# Patient Record
Sex: Female | Born: 1935 | Race: White | Hispanic: No | Marital: Married | Smoking: Former smoker
Health system: Southern US, Community
[De-identification: ages and names within clinical notes are randomized; demographics above are authoritative.]

## PROBLEM LIST (undated history)

## (undated) DIAGNOSIS — M25462 Effusion, left knee: Secondary | ICD-10-CM

## (undated) DIAGNOSIS — C8330 Diffuse large B-cell lymphoma, unspecified site: Secondary | ICD-10-CM

## (undated) DIAGNOSIS — E119 Type 2 diabetes mellitus without complications: Secondary | ICD-10-CM

## (undated) DIAGNOSIS — I480 Paroxysmal atrial fibrillation: Secondary | ICD-10-CM

## (undated) DIAGNOSIS — Z7409 Other reduced mobility: Secondary | ICD-10-CM

## (undated) DIAGNOSIS — W19XXXA Unspecified fall, initial encounter: Secondary | ICD-10-CM

## (undated) DIAGNOSIS — S32001A Stable burst fracture of unspecified lumbar vertebra, initial encounter for closed fracture: Secondary | ICD-10-CM

## (undated) DIAGNOSIS — E039 Hypothyroidism, unspecified: Secondary | ICD-10-CM

## (undated) DIAGNOSIS — I10 Essential (primary) hypertension: Secondary | ICD-10-CM

## (undated) HISTORY — DX: Essential (primary) hypertension: I10

## (undated) HISTORY — DX: Stable burst fracture of unspecified lumbar vertebra, initial encounter for closed fracture: S32.001A

## (undated) HISTORY — DX: Diffuse large B-cell lymphoma, unspecified site: C83.30

## (undated) HISTORY — DX: Unspecified fall, initial encounter: W19.XXXA

## (undated) HISTORY — DX: Effusion, left knee: M25.462

## (undated) HISTORY — DX: Paroxysmal atrial fibrillation: I48.0

## (undated) HISTORY — DX: Type 2 diabetes mellitus without complications: E11.9

## (undated) HISTORY — DX: Other reduced mobility: Z74.09

## (undated) HISTORY — DX: Hypothyroidism, unspecified: E03.9

---

## 2011-10-05 ENCOUNTER — Other Ambulatory Visit (HOSPITAL_COMMUNITY): Payer: Self-pay | Admitting: Endocrinology

## 2011-10-05 DIAGNOSIS — E0590 Thyrotoxicosis, unspecified without thyrotoxic crisis or storm: Secondary | ICD-10-CM

## 2011-10-10 ENCOUNTER — Encounter (HOSPITAL_COMMUNITY)
Admission: RE | Admit: 2011-10-10 | Discharge: 2011-10-10 | Disposition: A | Discharge status: Home / Self Care | Payer: Medicare PPO | Source: Ambulatory Visit | Attending: Endocrinology | Admitting: Endocrinology

## 2011-10-10 DIAGNOSIS — E0590 Thyrotoxicosis, unspecified without thyrotoxic crisis or storm: Secondary | ICD-10-CM

## 2011-10-11 ENCOUNTER — Encounter (HOSPITAL_COMMUNITY)
Admission: RE | Admit: 2011-10-11 | Discharge: 2011-10-11 | Disposition: A | Discharge status: Home / Self Care | Payer: Medicare PPO | Source: Ambulatory Visit | Attending: Endocrinology | Admitting: Endocrinology

## 2011-10-11 MED ORDER — SODIUM PERTECHNETATE TC 99M INJECTION
9.8000 | Freq: Once | INTRAVENOUS | Status: AC | PRN
  Administered 2011-10-11: 9.8 via INTRAVENOUS

## 2011-10-11 MED ORDER — SODIUM IODIDE I 131 CAPSULE
8.3000 | Freq: Once | INTRAVENOUS | Status: AC | PRN
  Administered 2011-10-10: 8.3 via ORAL

## 2011-11-06 ENCOUNTER — Ambulatory Visit (HOSPITAL_COMMUNITY): Payer: Self-pay

## 2011-11-07 ENCOUNTER — Other Ambulatory Visit (HOSPITAL_COMMUNITY): Payer: Self-pay

## 2012-07-05 ENCOUNTER — Other Ambulatory Visit (HOSPITAL_COMMUNITY): Payer: Self-pay | Admitting: Endocrinology

## 2012-07-05 DIAGNOSIS — E039 Hypothyroidism, unspecified: Secondary | ICD-10-CM

## 2012-07-05 DIAGNOSIS — E0590 Thyrotoxicosis, unspecified without thyrotoxic crisis or storm: Secondary | ICD-10-CM

## 2012-08-19 ENCOUNTER — Encounter (HOSPITAL_COMMUNITY)
Admission: RE | Admit: 2012-08-19 | Discharge: 2012-08-19 | Disposition: A | Discharge status: Home / Self Care | Payer: Medicare PPO | Source: Ambulatory Visit | Attending: Endocrinology | Admitting: Endocrinology

## 2012-08-19 DIAGNOSIS — E039 Hypothyroidism, unspecified: Secondary | ICD-10-CM

## 2012-08-19 DIAGNOSIS — E0590 Thyrotoxicosis, unspecified without thyrotoxic crisis or storm: Secondary | ICD-10-CM | POA: Insufficient documentation

## 2012-08-20 ENCOUNTER — Encounter (HOSPITAL_COMMUNITY)
Admission: RE | Admit: 2012-08-20 | Discharge: 2012-08-20 | Disposition: A | Discharge status: Home / Self Care | Payer: Medicare PPO | Source: Ambulatory Visit | Attending: Endocrinology | Admitting: Endocrinology

## 2012-08-20 MED ORDER — SODIUM IODIDE I 131 CAPSULE: 10.0000 | Freq: Once | INTRAVENOUS | Status: AC | PRN

## 2012-08-23 ENCOUNTER — Encounter (HOSPITAL_COMMUNITY)
Admission: RE | Admit: 2012-08-23 | Discharge: 2012-08-23 | Disposition: A | Discharge status: Home / Self Care | Payer: Medicare PPO | Source: Ambulatory Visit | Attending: Endocrinology | Admitting: Endocrinology

## 2012-08-23 DIAGNOSIS — E0590 Thyrotoxicosis, unspecified without thyrotoxic crisis or storm: Secondary | ICD-10-CM

## 2012-08-23 MED ORDER — SODIUM IODIDE I 131 CAPSULE
31.3000 | Freq: Once | INTRAVENOUS | Status: AC | PRN
  Administered 2012-08-23: 31.3 via ORAL

## 2015-11-07 DIAGNOSIS — C8330 Diffuse large B-cell lymphoma, unspecified site: Secondary | ICD-10-CM | POA: Insufficient documentation

## 2015-11-07 HISTORY — DX: Diffuse large B-cell lymphoma, unspecified site: C83.30

## 2016-01-10 DIAGNOSIS — I480 Paroxysmal atrial fibrillation: Secondary | ICD-10-CM

## 2016-01-10 HISTORY — DX: Paroxysmal atrial fibrillation: I48.0

## 2016-04-18 DIAGNOSIS — M25462 Effusion, left knee: Secondary | ICD-10-CM

## 2016-04-18 HISTORY — DX: Effusion, left knee: M25.462

## 2016-12-23 DIAGNOSIS — S32001A Stable burst fracture of unspecified lumbar vertebra, initial encounter for closed fracture: Secondary | ICD-10-CM

## 2016-12-23 DIAGNOSIS — W19XXXA Unspecified fall, initial encounter: Secondary | ICD-10-CM

## 2016-12-23 HISTORY — DX: Stable burst fracture of unspecified lumbar vertebra, initial encounter for closed fracture: S32.001A

## 2016-12-23 HISTORY — PX: PUNCH BIOPSY OF SKIN: SHX6390

## 2016-12-23 HISTORY — DX: Unspecified fall, initial encounter: W19.XXXA

## 2016-12-24 LAB — BASIC METABOLIC PANEL
BUN: 19 (ref 4–21)
CREATININE: 1.4 — AB (ref 0.5–1.1)
POTASSIUM: 3.6 (ref 3.4–5.3)
Sodium: 131 — AB (ref 137–147)

## 2016-12-24 LAB — CBC AND DIFFERENTIAL
HCT: 41 (ref 36–46)
Hemoglobin: 14 (ref 12.0–16.0)
PLATELETS: 205 (ref 150–399)
WBC: 11.5

## 2016-12-24 LAB — POCT INR: INR: 1 (ref 0.9–1.1)

## 2016-12-27 HISTORY — PX: POSTERIOR LAMINECTOMY THORACIC AND LUMBAR SPINE: SHX2251

## 2017-01-04 ENCOUNTER — Encounter: Payer: Self-pay | Admitting: Internal Medicine

## 2017-01-04 ENCOUNTER — Non-Acute Institutional Stay (SKILLED_NURSING_FACILITY): Payer: Medicare PPO | Admitting: Internal Medicine

## 2017-01-04 DIAGNOSIS — I10 Essential (primary) hypertension: Secondary | ICD-10-CM

## 2017-01-04 DIAGNOSIS — E119 Type 2 diabetes mellitus without complications: Secondary | ICD-10-CM | POA: Diagnosis not present

## 2017-01-04 DIAGNOSIS — C8599 Non-Hodgkin lymphoma, unspecified, extranodal and solid organ sites: Secondary | ICD-10-CM

## 2017-01-04 DIAGNOSIS — I480 Paroxysmal atrial fibrillation: Secondary | ICD-10-CM

## 2017-01-04 DIAGNOSIS — E034 Atrophy of thyroid (acquired): Secondary | ICD-10-CM | POA: Diagnosis not present

## 2017-01-04 DIAGNOSIS — C8330 Diffuse large B-cell lymphoma, unspecified site: Secondary | ICD-10-CM | POA: Diagnosis not present

## 2017-01-04 DIAGNOSIS — S32001D Stable burst fracture of unspecified lumbar vertebra, subsequent encounter for fracture with routine healing: Secondary | ICD-10-CM

## 2017-01-04 NOTE — Progress Notes (Signed)
: Provider:  Noah Delaine. Suzanne Coil, MD Location:  [place] Room Number: 111 Place of Service:  SNF (31)  PCP: Jacelyn Pi, MD Patient Care Team: Jacelyn Pi, MD as PCP - General (Endocrinology)  Extended Emergency Contact Information Primary Emergency Contact: Maryclare Bean [place] Phone: [phone] Relation: Spouse     Allergies: Penicillins  Chief Complaint  Patient presents with  . New Admit To SNF    following hospitalization 12/23/16 to 01/03/17 [facility], L2 fracture after a fall    HPI: Patient is 83 y.o. female with diffuse large B-cell lymphoma, status post chemotherapy/radiation, atrial fib on Cirella toe, DM 2, hypertension, hypothyroidism who presented to an outside hospital with generalized lower extremity weakness. Patient had a history of recent L2 compression fracture in April 2018 managed conservatively by her PCP. She had some right radicular symptoms which had been intermittent since that fall. She reports some lower extremity weakness starting several days prior to presentation. She developed worsening lower extremity weakness yesterday and fell down and was unable to ambulate afterwards she was taken by EMS to outside hospital then brought to [place] [place] in ED showed a chronic L2 burst fracture with retropulsion. She denies any back pain currently or radicular symptoms. She denied any paresthesias or bowel bowel/bladder incontinence. She took her last xarelto yesterday morning. Patient was admitted to [facility] from 7/28-8/8 where she underwent back surgery to stabilize her  L2 burst fracture. Hospital course was complicated by the findings of a scalp lesion for which dermatology was consult 8 and a punch biopsy was taken which showed lymphoma cutis. Postsurgically patient did well and is ready for admission to skilled nursing for rehabilitation she was started on  her home dose of Eliquis on 12/30/16. Patient is admitted to skilled nursing facility for OT/PT. While at skilled nursing facility patient will be followed for atrial fibrillation treated with amiodarone and Eliquis and metoprolol, hypertension treated with clonidine patch Lasix hydralazine and metoprolol and diabetes mellitus treated with Amaryl, Januvia, pt on ARB and statin.  Past Medical History:  Diagnosis Date  . Closed burst fracture of lumbar vertebra ([place]) 12/23/2016  . DLBCL (diffuse large B cell lymphoma) ([place]) 11/07/2015  . DM (diabetes mellitus) ([place])   . Effusion, left knee 04/18/2016  . Fall 12/23/2016  . Hypertension   . Hypothyroidism   . Mobility impaired   . Paroxysmal atrial fibrillation ([place]) 01/10/2016    Past Surgical History:  Procedure Laterality Date  . POSTERIOR LAMINECTOMY THORACIC AND LUMBAR SPINE  12/27/2016   at L2. Dr. Royetta Asal  . PUNCH BIOPSY OF SKIN  12/23/2016   scalp, lymphoma cutis    Allergies as of 01/04/2017      Reactions   Penicillins       Medication List       Accurate as of 01/04/17 12:26 PM. Always use your most recent med list.          acetaminophen 325 MG tablet Commonly known as:  TYLENOL Take 650 mg by mouth. Take 2 tablets every 8 hours as needed for mild pain or fever   allopurinol 100 MG tablet Commonly known as:  ZYLOPRIM Take 100 mg by mouth. Take 1/2 tablet (50 mg) nightly   amiodarone 200 MG tablet Commonly known as:  PACERONE Take 200 mg by mouth. Take one tablet daily   Ammonium Lactate 10 % Crea Apply topically. Apply to  hip area as needed   cloNIDine 0.3 mg/24hr patch Commonly known as:  CATAPRES - Dosed in mg/24 hr Place 0.3 mg onto the skin once a week.   cyclobenzaprine 7.5 MG tablet Commonly known as:  FEXMID Take 7.5 mg by mouth. Take one tablet twice daily as needed for up to 10 days for muscle spasms   diphenhydramine-acetaminophen 25-500 MG Tabs tablet Commonly known as:  TYLENOL  PM Take by mouth. Take 2 tablets nightly as needed for sleep   ELIQUIS 2.5 MG Tabs tablet Generic drug:  apixaban Take 2.5 mg by mouth. Take one tablet twice a day   furosemide 40 MG tablet Commonly known as:  LASIX Take 40 mg by mouth. Take one tablet twice daily   glimepiride 4 MG tablet Commonly known as:  AMARYL Take 4 mg by mouth. Take one tablet daily   hydrALAZINE 50 MG tablet Commonly known as:  APRESOLINE Take 50 mg by mouth. Take one tablet twice daily   levothyroxine 25 MCG tablet Commonly known as:  SYNTHROID, LEVOTHROID Take 25 mcg by mouth daily before breakfast.   losartan 25 MG tablet Commonly known as:  COZAAR Take 25 mg by mouth. Take one tablet daily   lovastatin 20 MG tablet Commonly known as:  MEVACOR Take 20 mg by mouth. Take one tablet at bedtime   metoprolol succinate 100 MG 24 hr tablet Commonly known as:  TOPROL-XL Take 100 mg by mouth. Take one tablet daily   mupirocin ointment 2 % Commonly known as:  BACTROBAN Place 1 application into the nose. Apply once daily to drain site (and apply after showering)   oxycodone 5 MG capsule Commonly known as:  OXY-IR Take 5 mg by mouth. Take one - two tablet every 6 hours as needed for pain   polyethylene glycol packet Commonly known as:  MIRALAX / GLYCOLAX Take 17 g by mouth. Take 17 gram daily as needed for constipation   potassium chloride SA 20 MEQ tablet Commonly known as:  K-DUR,KLOR-CON Take 20 mEq by mouth. Take one tablet daily   saccharomyces boulardii 250 MG capsule Commonly known as:  FLORASTOR Take 250 mg by mouth. Take one tablet twice daily for probiotic   senna-docusate 8.6-50 MG tablet Commonly known as:  Senokot-S Take 1 tablet by mouth. Take 2 tablets nightly   sitaGLIPtin 50 MG tablet Commonly known as:  JANUVIA Take by mouth. Take one tablet daily   verapamil 120 MG tablet Commonly known as:  CALAN Take 120 mg by mouth. Take one tablet every 12 hours       Meds  ordered this encounter  Medications  . acetaminophen (TYLENOL) 325 MG tablet    Sig: Take 650 mg by mouth. Take 2 tablets every 8 hours as needed for mild pain or fever  . cyclobenzaprine (FEXMID) 7.5 MG tablet    Sig: Take 7.5 mg by mouth. Take one tablet twice daily as needed for up to 10 days for muscle spasms  . oxycodone (OXY-IR) 5 MG capsule    Sig: Take 5 mg by mouth. Take one - two tablet every 6 hours as needed for pain  . mupirocin ointment (BACTROBAN) 2 %    Sig: Place 1 application into the nose. Apply once daily to drain site (and apply after showering)  . senna-docusate (SENOKOT-S) 8.6-50 MG tablet    Sig: Take 1 tablet by mouth. Take 2 tablets nightly  . verapamil (CALAN) 120 MG tablet    Sig: Take 120  mg by mouth. Take one tablet every 12 hours  . allopurinol (ZYLOPRIM) 100 MG tablet    Sig: Take 100 mg by mouth. Take 1/2 tablet (50 mg) nightly  . amiodarone (PACERONE) 200 MG tablet    Sig: Take 200 mg by mouth. Take one tablet daily  . Ammonium Lactate 10 % CREA    Sig: Apply topically. Apply to hip area as needed  . cloNIDine (CATAPRES - DOSED IN MG/24 HR) 0.3 mg/24hr patch    Sig: Place 0.3 mg onto the skin once a week.  . diphenhydramine-acetaminophen (TYLENOL PM) 25-500 MG TABS tablet    Sig: Take by mouth. Take 2 tablets nightly as needed for sleep  . apixaban (ELIQUIS) 2.5 MG TABS tablet    Sig: Take 2.5 mg by mouth. Take one tablet twice a day  . furosemide (LASIX) 40 MG tablet    Sig: Take 40 mg by mouth. Take one tablet twice daily  . glimepiride (AMARYL) 4 MG tablet    Sig: Take 4 mg by mouth. Take one tablet daily  . hydrALAZINE (APRESOLINE) 50 MG tablet    Sig: Take 50 mg by mouth. Take one tablet twice daily  . sitaGLIPtin (JANUVIA) 50 MG tablet    Sig: Take by mouth. Take one tablet daily  . levothyroxine (SYNTHROID, LEVOTHROID) 25 MCG tablet    Sig: Take 25 mcg by mouth daily before breakfast.  . losartan (COZAAR) 25 MG tablet    Sig: Take 25 mg by  mouth. Take one tablet daily  . lovastatin (MEVACOR) 20 MG tablet    Sig: Take 20 mg by mouth. Take one tablet at bedtime  . metoprolol succinate (TOPROL-XL) 100 MG 24 hr tablet    Sig: Take 100 mg by mouth. Take one tablet daily  . polyethylene glycol (MIRALAX / GLYCOLAX) packet    Sig: Take 17 g by mouth. Take 17 gram daily as needed for constipation  . potassium chloride SA (K-DUR,KLOR-CON) 20 MEQ tablet    Sig: Take 20 mEq by mouth. Take one tablet daily  . saccharomyces boulardii (FLORASTOR) 250 MG capsule    Sig: Take 250 mg by mouth. Take one tablet twice daily for probiotic    Immunization History  Administered Date(s) Administered  . Influenza-Unspecified 02/27/2016    Social History  Substance Use Topics  . Smoking status: Former Smoker    Types: Cigarettes  . Smokeless tobacco: Never Used  . Alcohol use No    Family history is   Family History  Problem Relation Age of Onset  . Heart disease Mother   . Hypertension Mother   . Heart disease Father   . Stroke Father   . Heart disease Brother   . Diabetes Paternal Grandfather       Review of Systems  DATA OBTAINED: from patient, nurse GENERAL:  no fevers, fatigue, appetite changes SKIN: No itching, or rash EYES: No eye pain, redness, discharge EARS: No earache, tinnitus, change in hearing NOSE: No congestion, drainage or bleeding  MOUTH/THROAT: No mouth or tooth pain, No sore throat RESPIRATORY: No cough, wheezing, SOB CARDIAC: No chest pain, palpitations, lower extremity edema  GI: No abdominal pain, No N/V/D or constipation, No heartburn or reflux  GU: No dysuria, frequency or urgency, or incontinence  MUSCULOSKELETAL: No unrelieved bone/joint pain NEUROLOGIC: No headache, dizziness or focal weakness PSYCHIATRIC: No c/o anxiety or sadness   Vitals:   01/04/17 1129  BP: 114/78  Pulse: 80  Resp: 18  Temp: 98  F (36.7 C)  SpO2: 96%    SpO2 Readings from Last 1 Encounters:  01/04/17 96%   Body  mass index is 31.31 kg/m.     Physical Exam  GENERAL APPEARANCE: Alert, conversant,  No acute distress.  SKIN: No diaphoresis rash HEAD: Normocephalic, atraumatic  EYES: Conjunctiva/lids clear. Pupils round, reactive. EOMs intact.  EARS: External exam WNL, canals clear. Hearing grossly normal.  NOSE: No deformity or discharge.  MOUTH/THROAT: Lips w/o lesions  RESPIRATORY: Breathing is even, unlabored. Lung sounds are clear   CARDIOVASCULAR: Heart RRR no murmurs, rubs or gallops. No peripheral edema.   GASTROINTESTINAL: Abdomen is soft, non-tender, not distended w/ normal bowel sounds. GENITOURINARY: Bladder non tender, not distended  MUSCULOSKELETAL: Wearing back brace NEUROLOGIC:  Cranial nerves 2-12 grossly intact. Moves all extremities  PSYCHIATRIC: Mood and affect appropriate to situation, no behavioral issues  There are no active problems to display for this patient.     Labs reviewed: Basic Metabolic Panel:    Component Value Date/Time   NA 131 (A) 12/24/2016   K 3.6 12/24/2016   BUN 19 12/24/2016   CREATININE 1.4 (A) 12/24/2016     Recent Labs  12/24/16  NA 131*  K 3.6  BUN 19  CREATININE 1.4*   Liver Function Tests: No results for input(s): AST, ALT, ALKPHOS, BILITOT, PROT, ALBUMIN in the last 8760 hours. No results for input(s): LIPASE, AMYLASE in the last 8760 hours. No results for input(s): AMMONIA in the last 8760 hours. CBC:  Recent Labs  12/24/16  WBC 11.5  HGB 14.0  HCT 41  PLT 205   Lipid No results for input(s): CHOL, HDL, LDLCALC, TRIG in the last 8760 hours.  Cardiac Enzymes: No results for input(s): CKTOTAL, CKMB, CKMBINDEX, TROPONINI in the last 8760 hours. BNP: No results for input(s): BNP in the last 8760 hours. No results found for: MICROALBUR No results found for: HGBA1C No results found for: TSH No results found for: VITAMINB12 No results found for: FOLATE No results found for: IRON, TIBC, FERRITIN  Imaging and  Procedures obtained prior to SNF admission: Nm Rai Therapy For Hyperthyroidism  Result Date: 08/23/2012 *RADIOLOGY REPORT* Clinical Data: Hyperthyroidism NUCLEAR MEDICINE RADIOACTIVE IODINE THERAPY FOR HYPERTHYROIDISM Technique:  The risks and benefits of radioactive iodine therapy were discussed with the patient in detail. Alternative therapies were also mentioned. Radiation safety was discussed with the patient, including how to protect the general public from exposure. There were no barriers to communication.  Written consent was obtained.  The patient then received a capsule containing the radiopharmaceutical.  The patient will follow-up with the referring physician. Radiopharmaceutical: 31.3MILLI CURIE I-131 SODIUM IODIDE I 131 CAPSULE Comparison: Thyroid uptake 08/20/12 Findings: The patient has a laboratory values and symptomology consistent with hyperthyroidism . IMPRESSION:  I 131 therapy for hyperthyroidism (31.3 mCi) Original Report Authenticated By: Suzy Bouchard, M.D.     Not all labs, radiology exams or other studies done during hospitalization come through on my EPIC note; however they are reviewed by me.    Assessment and Plan  L2 BURST FRACTURE WITH BONY RETROPULSION-patient was taken to the OR and stabilized there were no complications with the surgery; SNF -patient is admitted to skilled nursing for OT PT; patient's pain medicine was changed to oxycodone as she was being admitted with multiple doses of Tylenol) 3 different medicines that would far exceed the daily 1 minute for a person of her age; Flexeril was scheduled  DLBCL (diffuse large B-cell lymphoma)/LYMPHOMA CUTIS-the last  visit new diagnosis based on a finding of a pink lesion on the scalp with resultant biopsy SNF-patient is to follow-up with her oncology as an outpatient; she is quite anxious about this  PAROXYSMAL ATRIAL FIB SNF -controlled; continue amiodarone 200 mg by mouth daily, Toprol-XL 100 mg by mouth  daily, verapamil 120 mg SR 1 by mouth every 12 hours and Eliquis 2.5 mg twice a day as prophylaxis  HYPERTENSION SNF -controlled plan to continue clonidine patch 0.3/24, 1 patch weekly, Lasix 40 mg twice a day, hydralazine 50 mg twice a day, Cozaar 25 mg daily, verapamil 120 mg SR 1 by mouth every 12 hours and metoprolol XL 100 mg by mouth daily; we will monitor every shift  DM 2 SNF -not stated as uncontrolled no labs are available; plan to continue Amaryl 4 mg daily and Januvia 50 mg daily; patient is on and a ARB and a statin  HYPERLIPIDEMIA ASSOCIATED WITH DIABETES mellitus type 2 SNF -plan to continue Mevacor 20 mg by mouth daily  HYPOTHYROIDISM SNF -not stated as uncontrolled; plan to continue Synthroid 25 g by mouth daily   Time spent greater than 45 minutes;> 50% of time with patient was spent reviewing records, labs, tests and studies, counseling and developing plan of care  Webb Silversmith D. Suzanne Coil, MD

## 2017-01-06 ENCOUNTER — Encounter: Payer: Self-pay | Admitting: Internal Medicine

## 2017-01-06 DIAGNOSIS — I10 Essential (primary) hypertension: Secondary | ICD-10-CM | POA: Insufficient documentation

## 2017-01-06 DIAGNOSIS — C8599 Non-Hodgkin lymphoma, unspecified, extranodal and solid organ sites: Secondary | ICD-10-CM | POA: Insufficient documentation

## 2017-01-06 DIAGNOSIS — E119 Type 2 diabetes mellitus without complications: Secondary | ICD-10-CM | POA: Insufficient documentation

## 2017-01-06 DIAGNOSIS — E039 Hypothyroidism, unspecified: Secondary | ICD-10-CM | POA: Insufficient documentation

## 2017-01-08 LAB — BASIC METABOLIC PANEL
BUN: 13 (ref 4–21)
Creatinine: 1.3 — AB (ref 0.5–1.1)
Glucose: 94
Potassium: 4.3 (ref 3.4–5.3)
Sodium: 140 (ref 137–147)

## 2017-01-08 LAB — CBC AND DIFFERENTIAL
HEMATOCRIT: 32 — AB (ref 36–46)
HEMOGLOBIN: 10.9 — AB (ref 12.0–16.0)
Platelets: 279 (ref 150–399)
WBC: 8.7

## 2017-01-10 ENCOUNTER — Other Ambulatory Visit: Payer: Self-pay

## 2017-01-22 ENCOUNTER — Non-Acute Institutional Stay (SKILLED_NURSING_FACILITY): Payer: Medicare PPO | Admitting: Internal Medicine

## 2017-01-22 ENCOUNTER — Encounter: Payer: Self-pay | Admitting: Internal Medicine

## 2017-01-22 DIAGNOSIS — C8599 Non-Hodgkin lymphoma, unspecified, extranodal and solid organ sites: Secondary | ICD-10-CM

## 2017-01-22 DIAGNOSIS — C8330 Diffuse large B-cell lymphoma, unspecified site: Secondary | ICD-10-CM | POA: Diagnosis not present

## 2017-01-22 DIAGNOSIS — I10 Essential (primary) hypertension: Secondary | ICD-10-CM | POA: Diagnosis not present

## 2017-01-22 DIAGNOSIS — S32001D Stable burst fracture of unspecified lumbar vertebra, subsequent encounter for fracture with routine healing: Secondary | ICD-10-CM

## 2017-01-22 DIAGNOSIS — I480 Paroxysmal atrial fibrillation: Secondary | ICD-10-CM | POA: Diagnosis not present

## 2017-01-22 DIAGNOSIS — E034 Atrophy of thyroid (acquired): Secondary | ICD-10-CM

## 2017-01-22 DIAGNOSIS — E119 Type 2 diabetes mellitus without complications: Secondary | ICD-10-CM

## 2017-01-22 NOTE — Progress Notes (Signed)
Location:  [place] Room Number: 111 Place of Service:  SNF (31)  Suzanne Alvarado. Suzanne Coil, MD  PCP: Jacelyn Pi, MD Patient Care Team: Jacelyn Pi, MD as PCP - General (Endocrinology)  Extended Emergency Contact Information Primary Emergency Contact: [address] [place], [place] of [place] Phone: [phone] Relation: Spouse Secondary Emergency Contact: [place] of [place] Mobile Phone: [phone] Relation: Daughter  Allergies  Allergen Reactions  . Penicillins     Chief Complaint  Patient presents with  . Discharge Note    discharge from SNF to home    HPI:  83 y.o. female  with diffuse large B-cell lymphoma, status post chemotherapy/radiation, atrial fib on xarelto, diabetes mellitus 2, hypertension, hypothyroidism who was admitted to [facility] from 7/28-8/8 where she was found to have an L2 burst fracture which was stage STABILIZED surgically. During that time she also had a punch biopsy done of a lesion on her scalp which showed lymphoma cutis. Patient is a admitted to skilled nursing facility with generalized weakness for OT PT and is now ready to be DC to home.    Past Medical History:  Diagnosis Date  . Closed burst fracture of lumbar vertebra ([place]) 12/23/2016  . DLBCL (diffuse large B cell lymphoma) ([place]) 11/07/2015  . DM (diabetes mellitus) ([place])   . Effusion, left knee 04/18/2016  . Fall 12/23/2016  . Hypertension   . Hypothyroidism   . Mobility impaired   . Paroxysmal atrial fibrillation ([place]) 01/10/2016    Past Surgical History:  Procedure Laterality Date  . POSTERIOR LAMINECTOMY THORACIC AND LUMBAR SPINE  12/27/2016   at L2. Dr. Royetta Asal  . PUNCH BIOPSY OF SKIN  12/23/2016   scalp, lymphoma cutis     reports that she has quit smoking. Her smoking use included Cigarettes. She has never used smokeless tobacco. She reports that she does not drink  alcohol or use drugs. Social History   Social History  . Marital status: Married    Spouse name: N/A  . Number of children: N/A  . Years of education: N/A   Occupational History  . retired Education officer, museum    Social History Main Topics  . Smoking status: Former Smoker    Types: Cigarettes  . Smokeless tobacco: Never Used  . Alcohol use No  . Drug use: No  . Sexual activity: Not on file   Other Topics Concern  . Not on file   Social History Narrative   Admitted to [facility] 12/28/2016   Married -Interior and spatial designer   Former smoker   Alcohol none   DNR    Pertinent  Health Maintenance Due  Topic Date Due  . HEMOGLOBIN A1C  1931/01/17  . FOOT EXAM  12/29/1940  . OPHTHALMOLOGY EXAM  [birth year]29/1940  . DEXA SCAN  12/28/1997  . PNA vac Low Risk Adult (1 of 2 - PCV13) 12/28/1997  . INFLUENZA VACCINE  12/27/2016    Medications: Allergies as of 01/22/2017      Reactions   Penicillins       Medication List       Accurate as of 01/22/17  9:36 AM. Always use your most recent med list.          acetaminophen 325 MG tablet Commonly known as:  TYLENOL Take 650 mg by mouth. Take 2 tablets every 8 hours as needed for mild pain or fever  allopurinol 100 MG tablet Commonly known as:  ZYLOPRIM Take 100 mg by mouth. Take 1/2 tablet (50 mg) nightly   amiodarone 200 MG tablet Commonly known as:  PACERONE Take 200 mg by mouth. Take one tablet daily   Ammonium Lactate 10 % Crea Apply topically. Apply to hip area as needed   cloNIDine 0.3 mg/24hr patch Commonly known as:  CATAPRES - Dosed in mg/24 hr Place 0.3 mg onto the skin once a week.   diphenhydramine-acetaminophen 25-500 MG Tabs tablet Commonly known as:  TYLENOL PM Take by mouth. Take 2 tablets nightly as needed for sleep   ELIQUIS 2.5 MG Tabs tablet Generic drug:  apixaban Take 2.5 mg by mouth. Take one tablet twice a day   furosemide 40 MG tablet Commonly known as:  LASIX Take 40 mg by mouth. Take one  tablet twice daily   glimepiride 4 MG tablet Commonly known as:  AMARYL Take 4 mg by mouth. Take one tablet daily   hydrALAZINE 50 MG tablet Commonly known as:  APRESOLINE Take 50 mg by mouth. Take one tablet twice daily   levothyroxine 25 MCG tablet Commonly known as:  SYNTHROID, LEVOTHROID Take 25 mcg by mouth daily before breakfast.   losartan 25 MG tablet Commonly known as:  COZAAR Take 25 mg by mouth. Take one tablet daily   lovastatin 20 MG tablet Commonly known as:  MEVACOR Take 20 mg by mouth. Take one tablet at bedtime   metoprolol succinate 100 MG 24 hr tablet Commonly known as:  TOPROL-XL Take 100 mg by mouth. Take one tablet daily   mupirocin ointment 2 % Commonly known as:  BACTROBAN Place 1 application into the nose. Apply once daily to drain site (and apply after showering)   oxycodone 5 MG capsule Commonly known as:  OXY-IR Take 5 mg by mouth. Take one - two tablet every 6 hours as needed for pain   polyethylene glycol packet Commonly known as:  MIRALAX / GLYCOLAX Take 17 g by mouth. Take 17 gram daily as needed for constipation   potassium chloride SA 20 MEQ tablet Commonly known as:  K-DUR,KLOR-CON Take 20 mEq by mouth. Take one tablet daily   saccharomyces boulardii 250 MG capsule Commonly known as:  FLORASTOR Take 250 mg by mouth. Take one tablet twice daily for probiotic   senna-docusate 8.6-50 MG tablet Commonly known as:  Senokot-S Take 1 tablet by mouth. Take 2 tablets nightly   sitaGLIPtin 50 MG tablet Commonly known as:  JANUVIA Take by mouth. Take one tablet daily   verapamil 120 MG tablet Commonly known as:  CALAN Take 120 mg by mouth. Take one tablet every 12 hours        Vitals:   01/22/17 0920  BP: 126/74  Pulse: 72  Resp: 18  Temp: (!) 97.2 F (36.2 C)  SpO2: 94%  Weight: 202 lb (91.6 kg)  Height: 5\' 6"  (1.676 m)   Body mass index is 32.6 kg/m.  Physical Exam  GENERAL APPEARANCE: Alert, conversant. No acute  distress.  HEENT: Unremarkable. RESPIRATORY: Breathing is even, unlabored. Lung sounds are clear   CARDIOVASCULAR: Heart RRR no murmurs, rubs or gallops. No peripheral edema.  GASTROINTESTINAL: Abdomen is soft, non-tender, not distended w/ normal bowel sounds.  NEUROLOGIC: Cranial nerves 2-12 grossly intact. Moves all extremities   Labs reviewed: Basic Metabolic Panel:  Recent Labs  12/24/16 01/08/17  NA 131* 140  K 3.6 4.3  BUN 19 13  CREATININE 1.4* 1.3*  No results found for: [facility] Liver Function Tests: No results for input(s): AST, ALT, ALKPHOS, BILITOT, PROT, ALBUMIN in the last 8760 hours. No results for input(s): LIPASE, AMYLASE in the last 8760 hours. No results for input(s): AMMONIA in the last 8760 hours. CBC:  Recent Labs  12/24/16 01/08/17  WBC 11.5 8.7  HGB 14.0 10.9*  HCT 41 32*  PLT 205 279   Lipid No results for input(s): CHOL, HDL, LDLCALC, TRIG in the last 8760 hours. Cardiac Enzymes: No results for input(s): CKTOTAL, CKMB, CKMBINDEX, TROPONINI in the last 8760 hours. BNP: No results for input(s): BNP in the last 8760 hours. CBG: No results for input(s): GLUCAP in the last 8760 hours.  Procedures and Imaging Studies During Stay: No results found.  Assessment/Plan:   No diagnosis found.   Patient is being discharged with the following home health services:  OT/PT/nursing  Patient is being discharged with the following durable medical equipment:  None  Patient has been advised to f/u with their PCP in 1-2 weeks to bring them up to date on their rehab stay.  Social services at facility was responsible for arranging this appointment.  Pt was provided with a 30 day supply of prescriptions for medications and refills must be obtained from their PCP.  For controlled substances, a more limited supply may be provided adequate until PCP appointment only.  Medications have been reconciled.  Time spent greater than 30 minutes;> 50% of time with  patient was spent reviewing records, labs, tests and studies, counseling and developing plan of care  Suzanne Alvarado. Suzanne Coil, MD

## 2017-02-19 ENCOUNTER — Other Ambulatory Visit: Payer: Self-pay | Admitting: Internal Medicine

## 2017-02-20 ENCOUNTER — Other Ambulatory Visit: Payer: Self-pay | Admitting: Internal Medicine

## 2017-02-27 ENCOUNTER — Other Ambulatory Visit: Payer: Self-pay | Admitting: Internal Medicine

## 2017-03-12 ENCOUNTER — Other Ambulatory Visit: Payer: Self-pay | Admitting: Internal Medicine

## 2017-09-07 ENCOUNTER — Other Ambulatory Visit (HOSPITAL_COMMUNITY): Payer: Self-pay | Admitting: Internal Medicine

## 2017-09-07 DIAGNOSIS — R1310 Dysphagia, unspecified: Secondary | ICD-10-CM

## 2017-09-10 ENCOUNTER — Ambulatory Visit (HOSPITAL_COMMUNITY)
Admission: RE | Admit: 2017-09-10 | Discharge: 2017-09-10 | Disposition: A | Discharge status: Home / Self Care | Payer: Medicare PPO | Source: Ambulatory Visit | Attending: Internal Medicine | Admitting: Internal Medicine

## 2017-09-10 DIAGNOSIS — J189 Pneumonia, unspecified organism: Secondary | ICD-10-CM | POA: Insufficient documentation

## 2017-09-10 DIAGNOSIS — R1310 Dysphagia, unspecified: Secondary | ICD-10-CM | POA: Insufficient documentation

## 2018-05-29 DEATH — deceased

## 2019-08-23 IMAGING — RF DG SWALLOWING FUNCTION
15 series · 24 of 24 positions shown · non-contrast
Comparison: None.

CLINICAL DATA: Dysphagia

EXAM:
MODIFIED BARIUM SWALLOW
TECHNIQUE: Different consistencies of barium were administered orally to the
patient by the Speech Pathologist. Imaging of the pharynx was
performed in the lateral projection.
FLUOROSCOPY TIME:  Fluoroscopy Time:  2.2 minutes
Radiation Exposure Index (if provided by the fluoroscopic device):
6.8 mGy
Number of Acquired Spot Images: 0

[Series 1: cp_standard · 0.34mm/px · 2 of 59 frames shown (1 of 15)]
[frame 9/59]
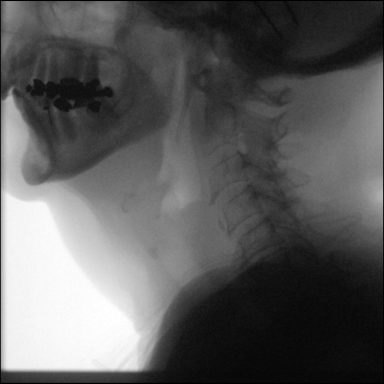
[frame 51/59]
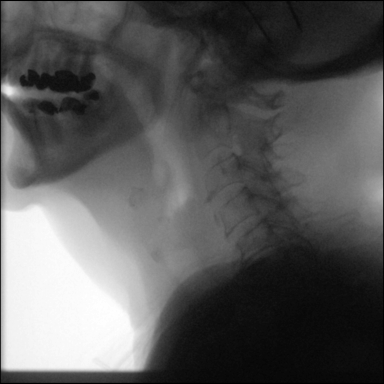

[Series 2: cp_standard · 0.34mm/px · 1 of 38 frames shown (2 of 15)]
[frame 20/38]
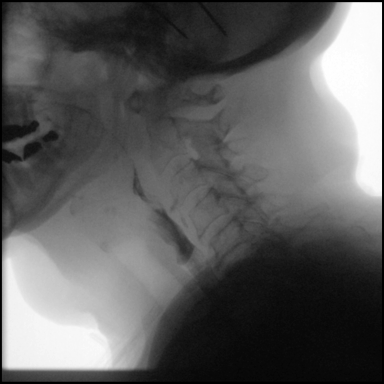

[Series 3: cp_standard · 0.34mm/px · 2 of 43 frames shown (3 of 15)]
[frame 7/43]
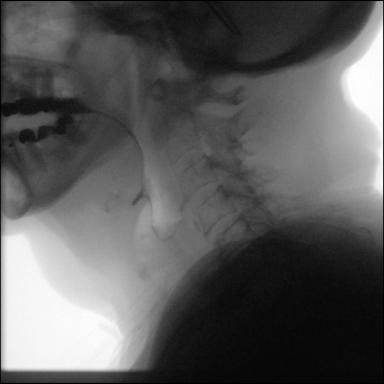
[frame 31/43]
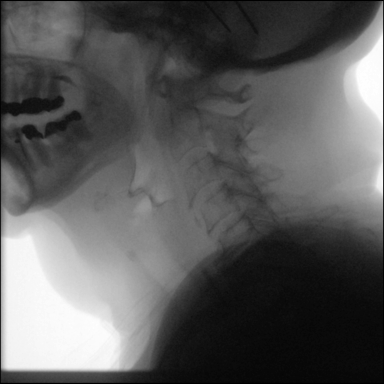

[Series 4: cp_standard · 0.34mm/px · 2 of 100 frames shown (4 of 15)]
[frame 21/100]
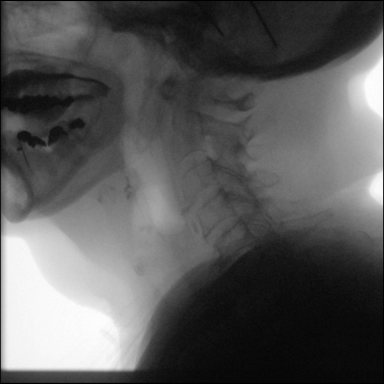
[frame 86/100]
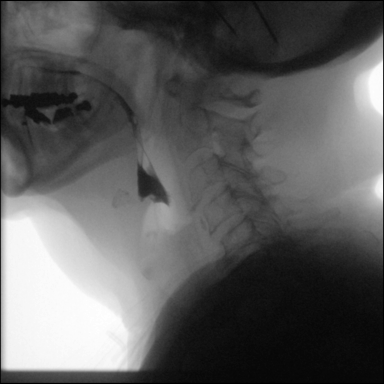

[Series 5: cp_standard · 0.34mm/px · 1 of 16 frames shown (5 of 15)]
[frame 12/16]
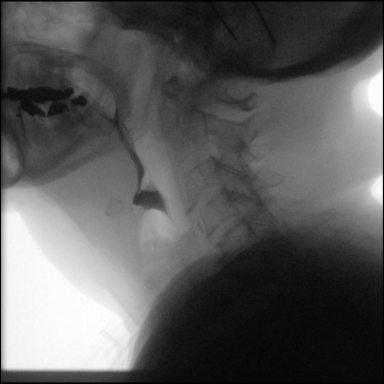

[Series 6: cp_standard · 0.34mm/px · 2 of 112 frames shown (6 of 15)]
[frame 17/112]
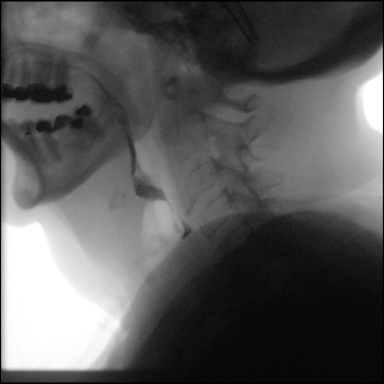
[frame 96/112]
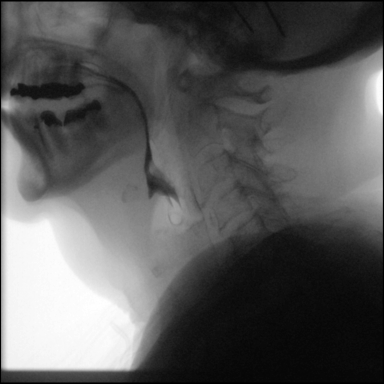

[Series 7: cp_standard · 0.34mm/px · 1 of 118 frames shown (7 of 15)]
[frame 18/118]
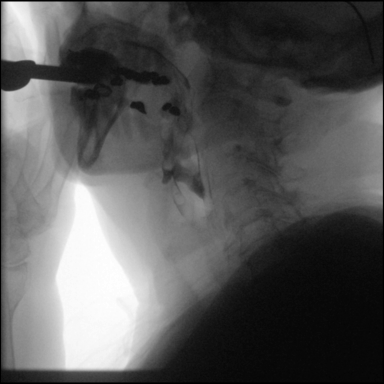

[Series 8: cp_standard · 0.34mm/px · 2 of 160 frames shown (8 of 15)]
[frame 25/160]
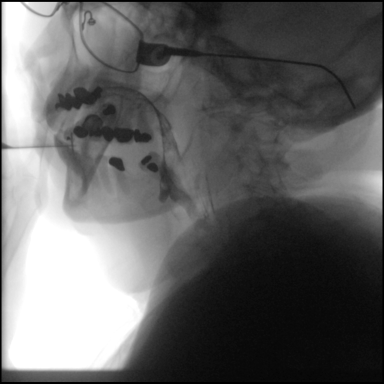
[frame 137/160]
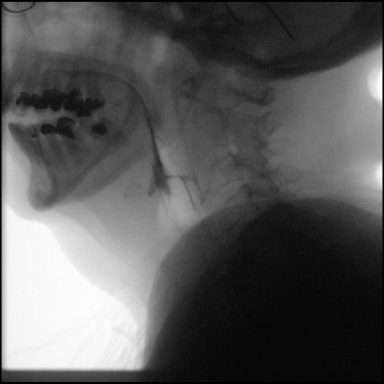

[Series 9: cp_standard · 0.34mm/px · 1 of 18 frames shown (9 of 15)]
[frame 10/18]
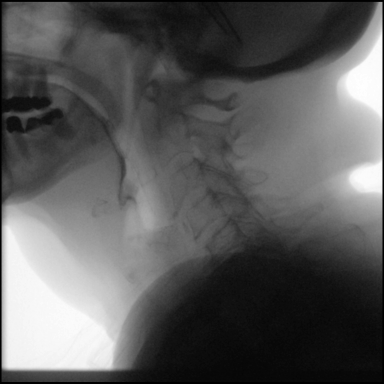

[Series 10: cp_standard · 0.34mm/px · 2 of 89 frames shown (10 of 15)]
[frame 14/89]
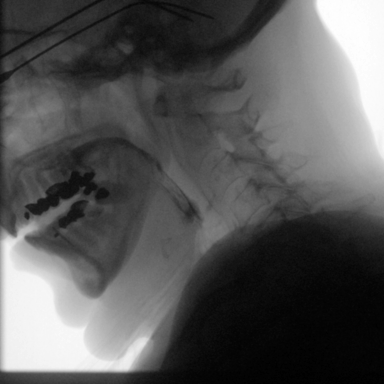
[frame 86/89]
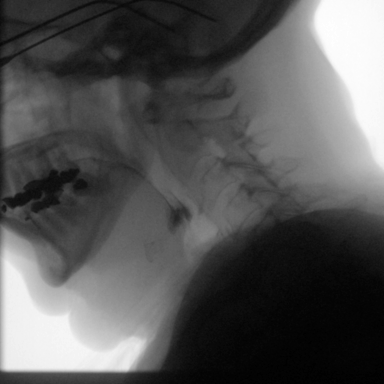

[Series 11: cp_standard · 0.34mm/px · 1 of 170 frames shown (11 of 15)]
[frame 61/170]
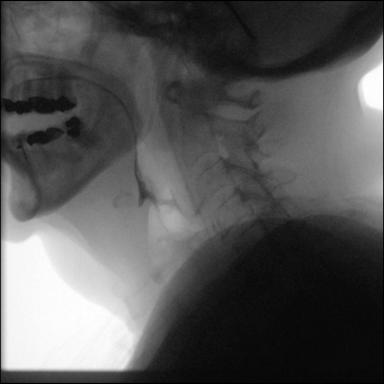

[Series 12: cp_standard · 0.34mm/px · 2 of 328 frames shown (12 of 15)]
[frame 50/328]
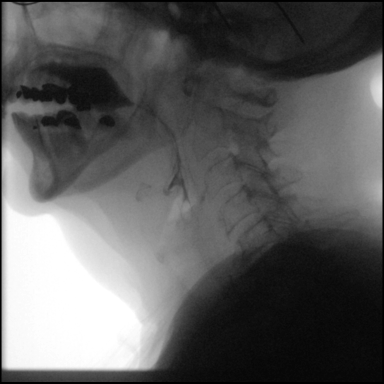
[frame 279/328]
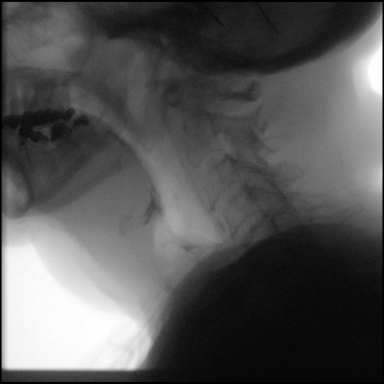

[Series 13: cp_standard · 0.34mm/px · 2 of 89 frames shown (13 of 15)]
[frame 14/89]
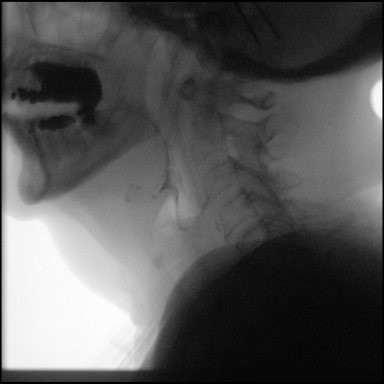
[frame 76/89]
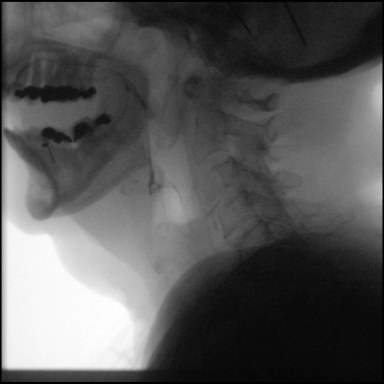

[Series 14: cp_standard · 0.34mm/px · 1 of 66 frames shown (14 of 15)]
[frame 34/66]
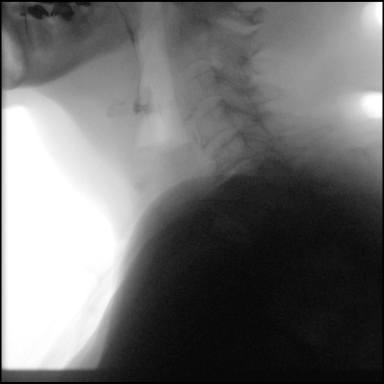

[Series 15: cp_standard · 0.35mm/px · 2 of 294 frames shown (15 of 15)]
[frame 45/294]
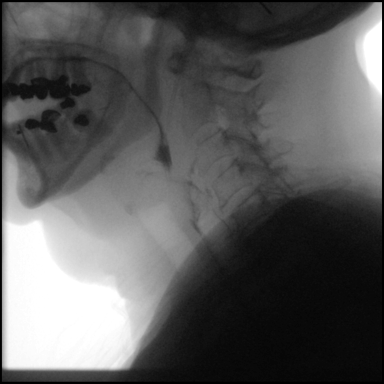
[frame 250/294]
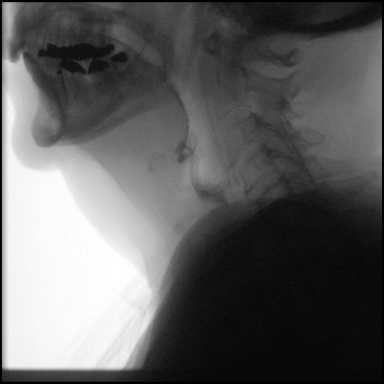

[24 of 24 positions shown; findings below may reference images not displayed]

FINDINGS: Thin liquid-occasional trace laryngeal penetration. Small amount of
residual in the vallecula.

Nectar thick liquid-small amount of residual in the vallecula

Honey-not assessed

Pure-moderate large residual in the vallecula

Cracker-not assessed

Pure with cracker-small amount of residual within the vallecula

Barium tablet -  within normal limits
IMPRESSION: Residual in the vallecula.

Occasional trace laryngeal penetration with thin liquids.

Please refer to the Speech Pathologists report for complete details
and recommendations.
